# Patient Record
Sex: Female | Born: 1975
Health system: Southern US, Community
[De-identification: ages and names within clinical notes are randomized; demographics above are authoritative.]

## PROBLEM LIST (undated history)

## (undated) DIAGNOSIS — R519 Headache, unspecified: Secondary | ICD-10-CM

## (undated) DIAGNOSIS — R51 Headache: Secondary | ICD-10-CM

## (undated) DIAGNOSIS — E785 Hyperlipidemia, unspecified: Secondary | ICD-10-CM

## (undated) DIAGNOSIS — D649 Anemia, unspecified: Secondary | ICD-10-CM

## (undated) HISTORY — PX: WISDOM TOOTH EXTRACTION: SHX21

## (undated) HISTORY — PX: DILATION AND CURETTAGE OF UTERUS: SHX78

---

## 2009-04-25 ENCOUNTER — Emergency Department (HOSPITAL_COMMUNITY): Admission: EM | Admit: 2009-04-25 | Discharge: 2009-04-25 | Payer: Self-pay | Admitting: Emergency Medicine

## 2015-03-17 ENCOUNTER — Encounter (HOSPITAL_COMMUNITY): Payer: Self-pay | Admitting: Cardiology

## 2015-03-17 ENCOUNTER — Emergency Department (HOSPITAL_COMMUNITY)
Admission: EM | Admit: 2015-03-17 | Discharge: 2015-03-17 | Disposition: A | Discharge status: Home / Self Care | Payer: BLUE CROSS/BLUE SHIELD | Attending: Emergency Medicine | Admitting: Emergency Medicine

## 2015-03-17 DIAGNOSIS — Y92410 Unspecified street and highway as the place of occurrence of the external cause: Secondary | ICD-10-CM | POA: Insufficient documentation

## 2015-03-17 DIAGNOSIS — M545 Low back pain, unspecified: Secondary | ICD-10-CM

## 2015-03-17 DIAGNOSIS — Y999 Unspecified external cause status: Secondary | ICD-10-CM | POA: Insufficient documentation

## 2015-03-17 DIAGNOSIS — S199XXA Unspecified injury of neck, initial encounter: Secondary | ICD-10-CM | POA: Insufficient documentation

## 2015-03-17 DIAGNOSIS — S3992XA Unspecified injury of lower back, initial encounter: Secondary | ICD-10-CM | POA: Insufficient documentation

## 2015-03-17 DIAGNOSIS — Y9389 Activity, other specified: Secondary | ICD-10-CM | POA: Diagnosis not present

## 2015-03-17 DIAGNOSIS — M542 Cervicalgia: Secondary | ICD-10-CM

## 2015-03-17 DIAGNOSIS — S0990XA Unspecified injury of head, initial encounter: Secondary | ICD-10-CM | POA: Insufficient documentation

## 2015-03-17 MED ORDER — METHOCARBAMOL 500 MG PO TABS: 500.0000 mg | ORAL_TABLET | Freq: Two times a day (BID) | ORAL | Status: DC

## 2015-03-17 MED ORDER — NAPROXEN 500 MG PO TABS: 500.0000 mg | ORAL_TABLET | Freq: Two times a day (BID) | ORAL | Status: DC
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
## 2015-03-17 NOTE — ED Notes (Signed)
Declined W/C at D/C and was escorted to lobby by RN. 
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
## 2016-02-29 ENCOUNTER — Other Ambulatory Visit: Payer: Self-pay | Admitting: Obstetrics and Gynecology

## 2016-02-29 DIAGNOSIS — R140 Abdominal distension (gaseous): Secondary | ICD-10-CM

## 2016-03-01 ENCOUNTER — Ambulatory Visit
Admission: RE | Admit: 2016-03-01 | Discharge: 2016-03-01 | Disposition: A | Discharge status: Home / Self Care | Payer: BLUE CROSS/BLUE SHIELD | Source: Ambulatory Visit | Attending: Obstetrics and Gynecology | Admitting: Obstetrics and Gynecology

## 2016-03-01 DIAGNOSIS — R140 Abdominal distension (gaseous): Secondary | ICD-10-CM

## 2017-04-23 ENCOUNTER — Other Ambulatory Visit: Payer: Self-pay | Admitting: Obstetrics and Gynecology

## 2017-04-24 NOTE — Patient Instructions (Signed)
Your procedure is scheduled on: Friday, April 12  Enter through the [facility]trance of [facility] at: 6 am  Pick up the phone at the desk and dial [phone].  Call this number if you have problems the morning of surgery: [phone].  Remember: Do NOT eat or Do NOT drink clear liquids (including water) after midnight Thursday  Take these medicines the morning of surgery with a SIP OF WATER: None  Bring inhaler with you on day of surgery.  Do Not smoke on the day of surgery.  Stop herbal medications and supplements at this time.  Do NOT wear jewelry (body piercing), metal hair clips/bobby pins, make-up, or nail polish. Do NOT wear lotions, powders, or perfumes.  You may wear deoderant. Do NOT shave for 48 hours prior to surgery. Do NOT bring valuables to the hospital. Contacts, dentures, or bridgework may not be worn into surgery.  Have a responsible adult drive you home and stay with you for 24 hours after your procedure.

## 2017-05-03 ENCOUNTER — Encounter (HOSPITAL_COMMUNITY): Payer: Self-pay | Admitting: *Deleted

## 2017-05-03 ENCOUNTER — Other Ambulatory Visit: Payer: Self-pay

## 2017-05-03 ENCOUNTER — Encounter (HOSPITAL_COMMUNITY)
Admission: RE | Admit: 2017-05-03 | Discharge: 2017-05-03 | Disposition: A | Discharge status: Home / Self Care | Payer: BLUE CROSS/BLUE SHIELD | Source: Ambulatory Visit | Attending: Obstetrics and Gynecology | Admitting: Obstetrics and Gynecology

## 2017-05-10 ENCOUNTER — Ambulatory Visit (HOSPITAL_COMMUNITY): Payer: BLUE CROSS/BLUE SHIELD | Admitting: Registered Nurse

## 2017-05-10 ENCOUNTER — Encounter (HOSPITAL_COMMUNITY): Payer: Self-pay | Admitting: Emergency Medicine

## 2017-05-10 ENCOUNTER — Ambulatory Visit (HOSPITAL_COMMUNITY)
Admission: AD | Admit: 2017-05-10 | Discharge: 2017-05-10 | Disposition: A | Discharge status: Home / Self Care | Payer: BLUE CROSS/BLUE SHIELD | Source: Ambulatory Visit | Attending: Obstetrics and Gynecology | Admitting: Obstetrics and Gynecology

## 2017-05-10 ENCOUNTER — Other Ambulatory Visit: Payer: Self-pay

## 2017-05-10 ENCOUNTER — Encounter (HOSPITAL_COMMUNITY)
Admission: AD | Disposition: A | Discharge status: Home / Self Care | Payer: Self-pay | Source: Ambulatory Visit | Attending: Obstetrics and Gynecology

## 2017-05-10 DIAGNOSIS — Z793 Long term (current) use of hormonal contraceptives: Secondary | ICD-10-CM | POA: Diagnosis not present

## 2017-05-10 DIAGNOSIS — D060 Carcinoma in situ of endocervix: Secondary | ICD-10-CM | POA: Diagnosis present

## 2017-05-10 DIAGNOSIS — Z885 Allergy status to narcotic agent status: Secondary | ICD-10-CM | POA: Diagnosis not present

## 2017-05-10 DIAGNOSIS — Z91018 Allergy to other foods: Secondary | ICD-10-CM | POA: Diagnosis not present

## 2017-05-10 HISTORY — DX: Headache: R51

## 2017-05-10 HISTORY — DX: Hyperlipidemia, unspecified: E78.5

## 2017-05-10 HISTORY — DX: Anemia, unspecified: D64.9

## 2017-05-10 HISTORY — PX: CERVICAL CONIZATION W/BX: SHX1330

## 2017-05-10 HISTORY — DX: Headache, unspecified: R51.9

## 2017-05-10 LAB — CBC
HCT: 33.5 % — ABNORMAL LOW (ref 36.0–46.0)
HEMOGLOBIN: 11.3 g/dL — AB (ref 12.0–15.0)
MCH: 28.8 pg (ref 26.0–34.0)
MCHC: 33.7 g/dL (ref 30.0–36.0)
MCV: 85.5 fL (ref 78.0–100.0)
PLATELETS: 251 10*3/uL (ref 150–400)
RBC: 3.92 MIL/uL (ref 3.87–5.11)
RDW: 13.1 % (ref 11.5–15.5)
WBC: 6 10*3/uL (ref 4.0–10.5)

## 2017-05-10 SURGERY — CONE BIOPSY, CERVIX
Anesthesia: General

## 2017-05-10 MED ORDER — FENTANYL CITRATE (PF) 100 MCG/2ML IJ SOLN
INTRAMUSCULAR | Status: DC | PRN
  Administered 2017-05-10: 25 ug via INTRAVENOUS
  Administered 2017-05-10: 50 ug via INTRAVENOUS
  Administered 2017-05-10: 25 ug via INTRAVENOUS

## 2017-05-10 MED ORDER — FENTANYL CITRATE (PF) 100 MCG/2ML IJ SOLN
INTRAMUSCULAR | Status: AC
  Filled 2017-05-10: qty 2

## 2017-05-10 MED ORDER — VASOPRESSIN 20 UNIT/ML IV SOLN
INTRAVENOUS | Status: AC
  Filled 2017-05-10: qty 1

## 2017-05-10 MED ORDER — OXYCODONE HCL 5 MG/5ML PO SOLN: 5.0000 mg | Freq: Once | ORAL | Status: AC | PRN

## 2017-05-10 MED ORDER — FERRIC SUBSULFATE 259 MG/GM EX SOLN
CUTANEOUS | Status: AC
  Filled 2017-05-10: qty 8

## 2017-05-10 MED ORDER — OXYCODONE HCL 5 MG PO TABS
ORAL_TABLET | ORAL | Status: DC
Start: 2017-05-10 — End: 2017-05-10
  Filled 2017-05-10: qty 1

## 2017-05-10 MED ORDER — PROPOFOL 10 MG/ML IV BOLUS
INTRAVENOUS | Status: DC | PRN
  Administered 2017-05-10: 200 mg via INTRAVENOUS

## 2017-05-10 MED ORDER — LACTATED RINGERS IV SOLN
INTRAVENOUS | Status: DC
  Administered 2017-05-10: 07:00:00 via INTRAVENOUS

## 2017-05-10 MED ORDER — ACETIC ACID 4% SOLUTION
Status: DC | PRN
  Administered 2017-05-10: 1 via TOPICAL

## 2017-05-10 MED ORDER — MEPERIDINE HCL 25 MG/ML IJ SOLN: 6.2500 mg | INTRAMUSCULAR | Status: DC | PRN

## 2017-05-10 MED ORDER — LIDOCAINE HCL (CARDIAC) 20 MG/ML IV SOLN
INTRAVENOUS | Status: AC
  Filled 2017-05-10: qty 5

## 2017-05-10 MED ORDER — LIDOCAINE-EPINEPHRINE 1 %-1:100000 IJ SOLN
INTRAMUSCULAR | Status: AC
  Filled 2017-05-10: qty 1

## 2017-05-10 MED ORDER — ONDANSETRON HCL 4 MG/2ML IJ SOLN
INTRAMUSCULAR | Status: DC | PRN
  Administered 2017-05-10: 4 mg via INTRAVENOUS

## 2017-05-10 MED ORDER — SCOPOLAMINE 1 MG/3DAYS TD PT72
1.0000 | MEDICATED_PATCH | Freq: Once | TRANSDERMAL | Status: DC
  Administered 2017-05-10: 1.5 mg via TRANSDERMAL

## 2017-05-10 MED ORDER — ONDANSETRON HCL 4 MG/2ML IJ SOLN
INTRAMUSCULAR | Status: AC
  Filled 2017-05-10: qty 2

## 2017-05-10 MED ORDER — KETOROLAC TROMETHAMINE 30 MG/ML IJ SOLN
INTRAMUSCULAR | Status: DC | PRN
  Administered 2017-05-10: 30 mg via INTRAVENOUS
  Administered 2017-05-10: 30 mg via INTRAMUSCULAR

## 2017-05-10 MED ORDER — SODIUM CHLORIDE 0.9 % IJ SOLN
INTRAMUSCULAR | Status: AC
  Filled 2017-05-10: qty 10

## 2017-05-10 MED ORDER — IODINE STRONG (LUGOLS) 5 % PO SOLN
ORAL | Status: AC
Start: 2017-05-10 — End: 2017-05-10
  Filled 2017-05-10: qty 1

## 2017-05-10 MED ORDER — DEXAMETHASONE SODIUM PHOSPHATE 10 MG/ML IJ SOLN
INTRAMUSCULAR | Status: AC
  Filled 2017-05-10: qty 1

## 2017-05-10 MED ORDER — MIDAZOLAM HCL 2 MG/2ML IJ SOLN
INTRAMUSCULAR | Status: AC
  Filled 2017-05-10: qty 2

## 2017-05-10 MED ORDER — IODINE STRONG (LUGOLS) 5 % PO SOLN
ORAL | Status: DC | PRN
  Administered 2017-05-10: 0.1 mL via ORAL

## 2017-05-10 MED ORDER — DEXAMETHASONE SODIUM PHOSPHATE 10 MG/ML IJ SOLN
INTRAMUSCULAR | Status: DC | PRN
  Administered 2017-05-10: 10 mg via INTRAVENOUS

## 2017-05-10 MED ORDER — PROPOFOL 10 MG/ML IV BOLUS
INTRAVENOUS | Status: AC
  Filled 2017-05-10: qty 20

## 2017-05-10 MED ORDER — LIDOCAINE 2% (20 MG/ML) 5 ML SYRINGE
INTRAMUSCULAR | Status: DC | PRN
  Administered 2017-05-10: 80 mg via INTRAVENOUS

## 2017-05-10 MED ORDER — SCOPOLAMINE 1 MG/3DAYS TD PT72
MEDICATED_PATCH | TRANSDERMAL | Status: AC
  Administered 2017-05-10: 1.5 mg via TRANSDERMAL
  Filled 2017-05-10: qty 1

## 2017-05-10 MED ORDER — HYDROMORPHONE HCL 1 MG/ML IJ SOLN
INTRAMUSCULAR | Status: AC
  Administered 2017-05-10: 0.5 mg via INTRAVENOUS
  Filled 2017-05-10: qty 0.5

## 2017-05-10 MED ORDER — PROMETHAZINE HCL 25 MG/ML IJ SOLN
6.2500 mg | INTRAMUSCULAR | Status: DC | PRN
Start: 2017-05-10 — End: 2017-05-10

## 2017-05-10 MED ORDER — LIDOCAINE-EPINEPHRINE 1 %-1:100000 IJ SOLN
INTRAMUSCULAR | Status: DC | PRN
  Administered 2017-05-10: 13 mL

## 2017-05-10 MED ORDER — ACETIC ACID 5 % SOLN
Status: AC
  Filled 2017-05-10: qty 500

## 2017-05-10 MED ORDER — KETOROLAC TROMETHAMINE 30 MG/ML IJ SOLN
INTRAMUSCULAR | Status: AC
  Filled 2017-05-10: qty 2

## 2017-05-10 MED ORDER — MIDAZOLAM HCL 5 MG/5ML IJ SOLN
INTRAMUSCULAR | Status: DC | PRN
  Administered 2017-05-10: 2 mg via INTRAVENOUS

## 2017-05-10 MED ORDER — HYDROMORPHONE HCL 1 MG/ML IJ SOLN
0.2500 mg | INTRAMUSCULAR | Status: DC | PRN
  Administered 2017-05-10: 0.5 mg via INTRAVENOUS

## 2017-05-10 MED ORDER — OXYCODONE HCL 5 MG PO TABS
5.0000 mg | ORAL_TABLET | Freq: Once | ORAL | Status: AC | PRN
  Administered 2017-05-10: 5 mg via ORAL

## 2017-05-10 SURGICAL SUPPLY — 23 items
APPLICATOR COTTON TIP 6 STRL (MISCELLANEOUS) IMPLANT
APPLICATOR COTTON TIP 6IN STRL (MISCELLANEOUS)
BLADE SURG 11 STRL SS (BLADE) ×2 IMPLANT
ELECT BALL LEEP 5MM RED (ELECTRODE) ×2 IMPLANT
ELECT REM PT RETURN 9FT ADLT (ELECTROSURGICAL) ×2
ELECTRODE REM PT RTRN 9FT ADLT (ELECTROSURGICAL) ×1 IMPLANT
GLOVE BIOGEL PI IND STRL 7.0 (GLOVE) ×2 IMPLANT
GLOVE BIOGEL PI INDICATOR 7.0 (GLOVE) ×2
GLOVE ECLIPSE 6.5 STRL STRAW (GLOVE) ×2 IMPLANT
GOWN STRL REUS W/TWL LRG LVL3 (GOWN DISPOSABLE) ×4 IMPLANT
NS IRRIG 1000ML POUR BTL (IV SOLUTION) ×2 IMPLANT
PACK VAGINAL MINOR WOMEN LF (CUSTOM PROCEDURE TRAY) ×2 IMPLANT
PAD OB MATERNITY 4.3X12.25 (PERSONAL CARE ITEMS) ×2 IMPLANT
PENCIL BUTTON HOLSTER BLD 10FT (ELECTRODE) ×2 IMPLANT
SCOPETTES 8  STERILE (MISCELLANEOUS) ×1
SCOPETTES 8 STERILE (MISCELLANEOUS) ×1 IMPLANT
SPONGE SURGIFOAM ABS GEL 12-7 (HEMOSTASIS) ×2 IMPLANT
SUT VIC AB 0 CT1 27 (SUTURE) ×2
SUT VIC AB 0 CT1 27XBRD ANBCTR (SUTURE) ×2 IMPLANT
SUT VIC AB 3-0 SH 27 (SUTURE)
SUT VIC AB 3-0 SH 27X BRD (SUTURE) IMPLANT
SUT VICRYL 0 UR6 27IN ABS (SUTURE) IMPLANT
TOWEL OR 17X24 6PK STRL BLUE (TOWEL DISPOSABLE) ×4 IMPLANT

## 2017-05-10 NOTE — Transfer of Care (Signed)
Immediate Anesthesia Transfer of Care Note  Patient: Shirley Randolph  Procedure(s) Performed: Cold Knife CONIZATION CERVIX (N/A )  Patient Location: PACU  Anesthesia Type:General  Level of Consciousness: sedated  Airway & Oxygen Therapy: Patient Spontanous Breathing and Patient connected to nasal cannula oxygen  Post-op Assessment: Report given to RN and Post -op Vital signs reviewed and stable  Post vital signs: Reviewed and stable  Last Vitals:  Vitals Value Taken Time  BP 95/63 05/10/2017  8:10 AM  Temp    Pulse 101 05/10/2017  8:11 AM  Resp 13 05/10/2017  8:11 AM  SpO2 100 % 05/10/2017  8:11 AM  Vitals shown include unvalidated device data.  Last Pain:  Vitals:   05/10/17 0608  TempSrc: Oral      Patients Stated Pain Goal: 5 (05/10/17 [id number])  Complications: No apparent anesthesia complications

## 2017-05-10 NOTE — Anesthesia Postprocedure Evaluation (Signed)
Anesthesia Post Note  Patient: Shirley Randolph  Procedure(s) Performed: Cold Knife CONIZATION CERVIX (N/A )     Patient location during evaluation: PACU Anesthesia Type: General Level of consciousness: awake and alert Pain management: pain level controlled Vital Signs Assessment: post-procedure vital signs reviewed and stable Respiratory status: spontaneous breathing, nonlabored ventilation and respiratory function stable Cardiovascular status: blood pressure returned to baseline and stable Postop Assessment: no apparent nausea or vomiting Anesthetic complications: no    Last Vitals:  Vitals:   05/10/17 0845 05/10/17 0900  BP: 108/67 105/66  Pulse: 96 98  Resp: 13 15  Temp:    SpO2: 96% 96%    Last Pain:  Vitals:   05/10/17 0900  TempSrc:   PainSc: 3    Pain Goal: Patients Stated Pain Goal: 5 (05/10/17 [id number])               Lowella CurbWarren Ray Sapir Lavey

## 2017-05-10 NOTE — Anesthesia Preprocedure Evaluation (Signed)
Anesthesia Evaluation  Patient identified by MRN, date of birth, ID band Patient awake    Reviewed: Allergy & Precautions, NPO status , Patient's Chart, lab work & pertinent test results  Airway Mallampati: II  TM Distance: >3 FB Neck ROM: Full    Dental no notable dental hx.    Pulmonary neg pulmonary ROS,    Pulmonary exam normal breath sounds clear to auscultation       Cardiovascular negative cardio ROS Normal cardiovascular exam Rhythm:Regular Rate:Normal     Neuro/Psych  Headaches, negative neurological ROS  negative psych ROS   GI/Hepatic negative GI ROS, Neg liver ROS,   Endo/Other  negative endocrine ROS  Renal/GU negative Renal ROS  negative genitourinary   Musculoskeletal negative musculoskeletal ROS (+)   Abdominal (+) + obese,   Peds negative pediatric ROS (+)  Hematology negative hematology ROS (+) anemia ,   Anesthesia Other Findings   Reproductive/Obstetrics negative OB ROS                             Anesthesia Physical Anesthesia Plan  ASA: II  Anesthesia Plan: General   Post-op Pain Management:    Induction: Intravenous  PONV Risk Score and Plan: 3 and Ondansetron, Dexamethasone and Midazolam  Airway Management Planned: LMA  Additional Equipment:   Intra-op Plan:   Post-operative Plan: Extubation in OR  Informed Consent: I have reviewed the patients History and Physical, chart, labs and discussed the procedure including the risks, benefits and alternatives for the proposed anesthesia with the patient or authorized representative who has indicated his/her understanding and acceptance.   Dental advisory given  Plan Discussed with: CRNA  Anesthesia Plan Comments:         Anesthesia Quick Evaluation

## 2017-05-10 NOTE — Discharge Instructions (Signed)
Cervical Conization, Care After °This sheet gives you information about how to care for yourself after your procedure. Your doctor may also give you more specific instructions. If you have problems or questions, contact your doctor. °Follow these instructions at home: °Medicines °· Take over-the-counter and prescription medicines only as told by your doctor. °· Do not take aspirin until your doctor says it is okay. °· If you take pain medicine: °? You may have constipation. To help treat this, your doctor may tell you to: °§ Drink enough fluid to keep your pee (urine) clear or pale yellow. °§ Take medicines. °§ Eat foods that are high in fiber. These include fresh fruits and vegetables, whole grains, bran, and beans. °§ Limit foods that are high in fat and sugar. These include fried foods and sweet foods. °? Do not drive or use heavy machines. °General instructions °· You can eat your usual diet unless your doctor tells you not to do so. °· Take showers for the first week. Do not take baths, swim, or use hot tubs until your doctor says it is okay. °· Do not douche, use tampons, or have sex until your doctor says it is okay. °· For 7-14 days after your procedure, avoid: °? Being very active. °? Exercising. °? Heavy lifting. °· Keep all follow-up visits as told by your doctor. This is important. °Contact a doctor if: °· You have a rash. °· You are dizzy or lightheaded. °· You feel sick to your stomach (nauseous). °· You throw up (vomit). °· You have fluid from your vagina (vaginal discharge) that smells bad. °Get help right away if: °· There are blood clots coming from your vagina. °· You have more bleeding than you would have in a normal period. For example, you soak a pad in less than 1 hour. °· You have a fever. °· You have more and more cramps. °· You pass out (faint). °· You have pain when peeing. °· Your have a lot of pain. °· Your pain gets worse. °· Your pain does not get better when you take your  medicine. °· You have blood in your pee. °· You throw up (vomit). °Summary °· After your procedure, take over-the-counter and prescription medicines only as told by your doctor. °· Do not douche, use tampons, or have sex until your doctor says it is okay. °· For about 7-14 days after your procedure, try not to exercise or lift heavy objects. °· Get help right away if you have new symptoms, or if your symptoms become worse. °This information is not intended to replace advice given to you by your health care provider. Make sure you discuss any questions you have with your health care provider. °Document Released: 10/25/2007 Document Revised: 01/18/2016 Document Reviewed: 01/18/2016 °Elsevier Interactive Patient Education © 2017 [employer] ° ° °Post Anesthesia Home Care Instructions ° °Activity: °Get plenty of rest for the remainder of the day. A responsible individual must stay with you for 24 hours following the procedure.  °For the next 24 hours, DO NOT: °-Drive a car °-Operate machinery °-Drink alcoholic beverages °-Take any medication unless instructed by your physician °-Make any legal decisions or sign important papers. ° °Meals: °Start with liquid foods such as gelatin or soup. Progress to regular foods as tolerated. Avoid greasy, spicy, heavy foods. If nausea and/or vomiting occur, drink only clear liquids until the nausea and/or vomiting subsides. Call your physician if vomiting continues. ° °Special Instructions/Symptoms: °Your throat may feel dry or sore from   the anesthesia or the breathing tube placed in your throat during surgery. If this causes discomfort, gargle with warm salt water. The discomfort should disappear within 24 hours. ° °If you had a scopolamine patch placed behind your ear for the management of post- operative nausea and/or vomiting: ° °1. The medication in the patch is effective for 72 hours, after which it should be removed.  Wrap patch in a tissue and discard in the trash. Wash  hands thoroughly with soap and water. °2. You may remove the patch earlier than 72 hours if you experience unpleasant side effects which may include dry mouth, dizziness or visual disturbances. °3. Avoid touching the patch. Wash your hands with soap and water after contact with the patch. °  ° ° °

## 2017-05-10 NOTE — Anesthesia Procedure Notes (Signed)
Procedure Name: LMA Insertion Date/Time: 05/10/2017 7:34 AM Performed by: Jhonnie GarnerMarshall, Tylie Golonka M, CRNA Pre-anesthesia Checklist: Patient identified, Emergency Drugs available, Suction available and Patient being monitored Patient Re-evaluated:Patient Re-evaluated prior to induction Oxygen Delivery Method: Circle system utilized Preoxygenation: Pre-oxygenation with 100% oxygen Induction Type: IV induction Ventilation: Mask ventilation without difficulty LMA: LMA inserted LMA Size: 4.0 Number of attempts: 1 Placement Confirmation: positive ETCO2 and breath sounds checked- equal and bilateral Tube secured with: Tape Dental Injury: Teeth and Oropharynx as per pre-operative assessment

## 2017-05-10 NOTE — Brief Op Note (Signed)
05/10/2017  8:11 AM  PATIENT:  Sherran NeedsGwendolyn Lortz  42 y.o. female  PRE-OPERATIVE DIAGNOSIS:  Positive Endocervical Curettage, CIN 2/3  POST-OPERATIVE DIAGNOSIS:  Positive Endocervical Curettage, CIN 2/3  PROCEDURE: cold knife conization, ECC  SURGEON:  Surgeon(s) and Role:    * Maxie Betterousins, Tomorrow Dehaas, MD - Primary  PHYSICIAN ASSISTANT:   ASSISTANTS: none   ANESTHESIA:   general  EBL:  5 mL   BLOOD ADMINISTERED:none  DRAINS: none   LOCAL MEDICATIONS USED:  NONE  SPECIMEN:  Source of Specimen:  cone, ecc  DISPOSITION OF SPECIMEN:  PATHOLOGY  COUNTS:  YES  TOURNIQUET:  * No tourniquets in log *  DICTATIO: [id number]: [id number]  PLAN OF CARE: Discharge to home after PACU  PATIENT DISPOSITION:  PACU - hemodynamically stable.   Delay start of Pharmacological VTE agent (>24hrs) due to surgical blood loss or risk of bleeding: no

## 2017-05-11 ENCOUNTER — Encounter (HOSPITAL_COMMUNITY): Payer: Self-pay | Admitting: Obstetrics and Gynecology

## 2017-05-13 NOTE — Op Note (Signed)
NAME:  Shirley Randolph, [name]                ACCOUNT NO.:  MEDICAL RECORD NO.:  [id number]  LOCATION:                                 FACILITY:  PHYSICIAN:  Maxie BetterSheronette Ileana Chalupa, M.D.    DATE OF BIRTH:  DATE OF PROCEDURE:  05/10/2017 DATE OF DISCHARGE:                              OPERATIVE REPORT   PREOPERATIVE DIAGNOSIS:  Cervical intraepithelial neoplasia 3, on endocervical curetting.  PROCEDURES:  Cold knife conization, endocervical curetting.  POSTOPERATIVE DIAGNOSIS:  Cervical intraepithelial neoplasia 3, on endocervical curetting, pending final pathology.  ANESTHESIA:  General.  SURGEON:  Maxie BetterSheronette Rashawd Laskaris, M.D.  ASSISTANT:  None.  DESCRIPTION OF PROCEDURE:  Under adequate general anesthesia, the patient was placed in dorsal lithotomy position.  She was sterilely prepped and draped in usual fashion.  Then, a bivalve speculum was placed in the vagina.  0 Vicryl figure-of-eight stay sutures were placed at 3 and 9 o'clock paracervical positions.  Mild acetic acid was used to wash the vagina.  Lugol solution was placed on the cervix.  Lidocaine 1% with epinephrine was injected circumferentially in the cervix.  Using a #11 blade, a cone was removed with the Mayo scissors at the base.  Once this was done, endocervical curetting was then performed.  The base was then cauterized and Monsel solution applied.  SPECIMENS:  Endocervical curetting and conization specimen was tagged at 12 o'clock.  ESTIMATED BLOOD LOSS:  Minimal.  COMPLICATIONS:  None.  The patient tolerated the procedure well, was transferred to recovery room in stable condition.     Maxie BetterSheronette Breanna Mcdaniel, M.D.    [place]/[facility]  D:  05/10/2017  T:  05/11/2017  Job:  [id number]

## 2018-04-20 IMAGING — US US ABDOMEN COMPLETE
1 series · 14 of 25 positions shown · non-contrast
Comparison: None.

CLINICAL DATA: Abdominal bloating for 2 weeks.

EXAM:
ABDOMEN ULTRASOUND COMPLETE

[Series 1: us abdomen complete · 0.12mm/px · 14 of 84 slices shown]
[im 1/84]
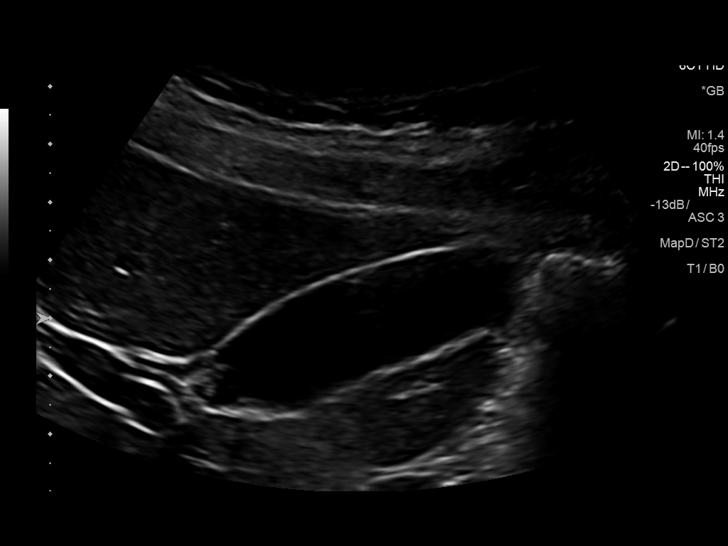
[im 7/84]
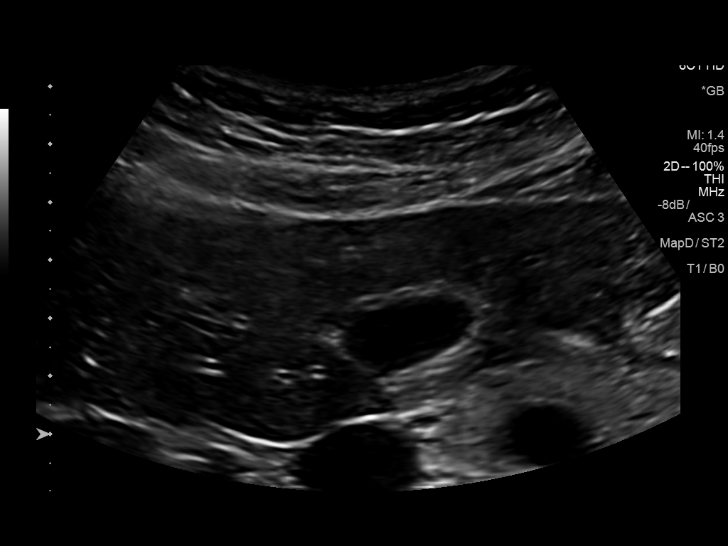
[im 14/84]
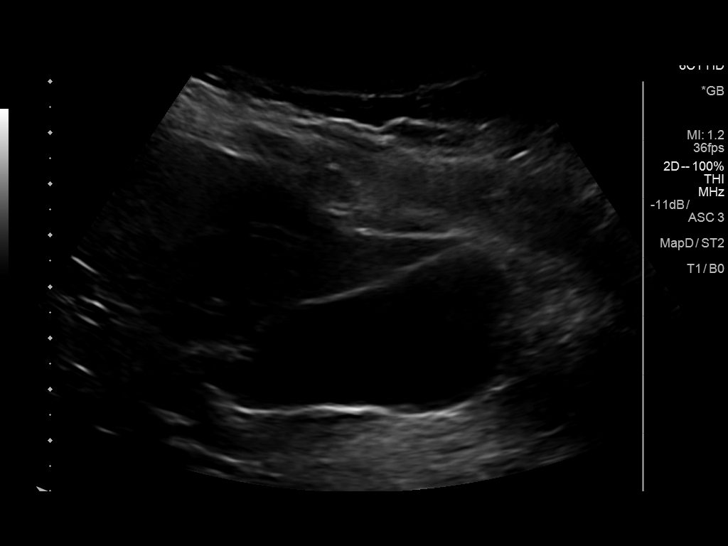
[im 21/84]
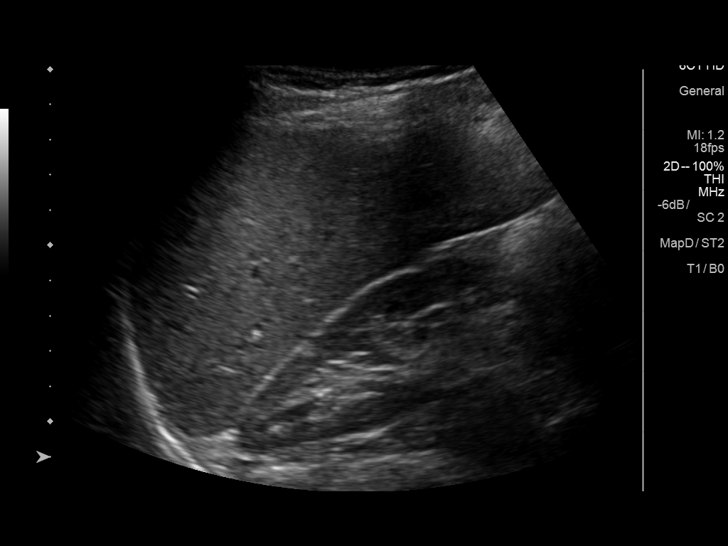
[im 28/84]
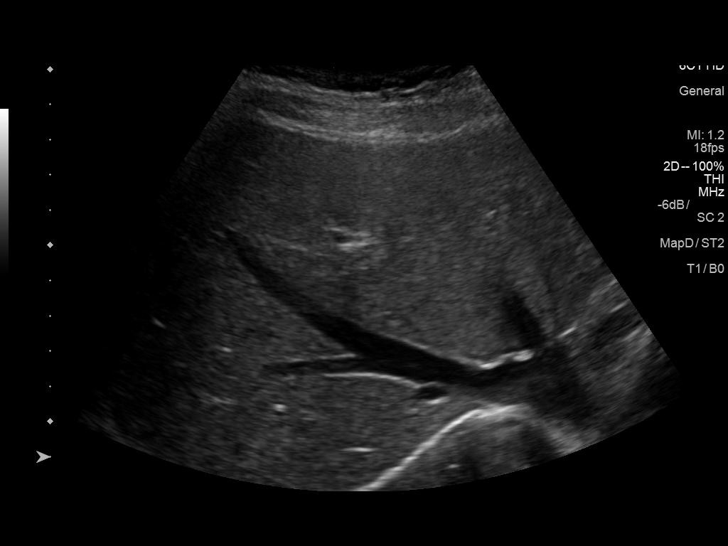
[im 32/84]
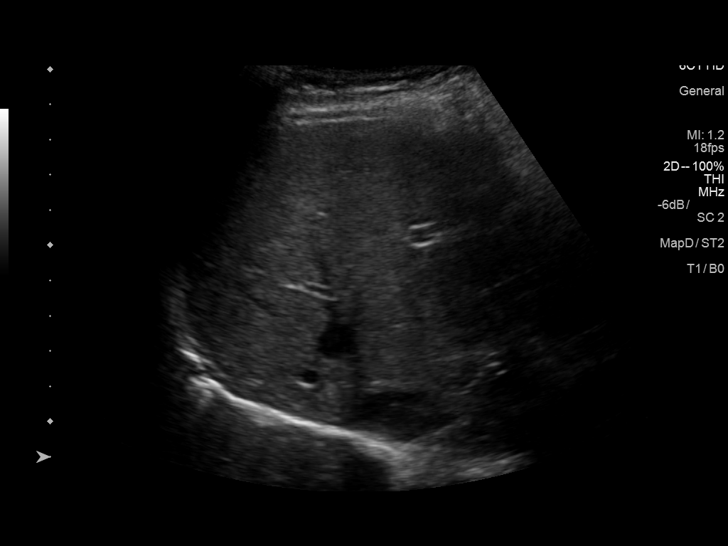
[im 39/84]
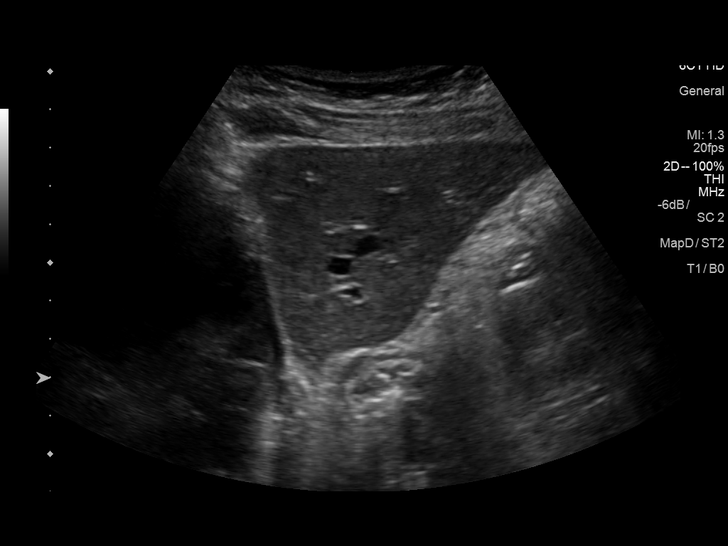
[im 45/84]
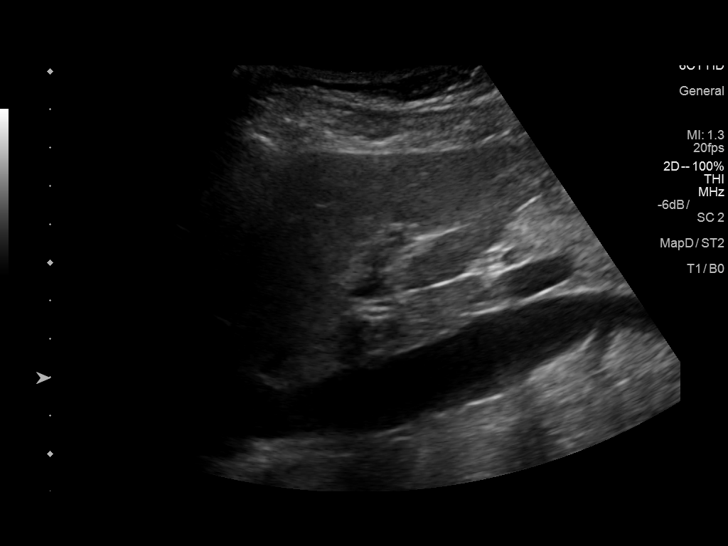
[im 52/84]
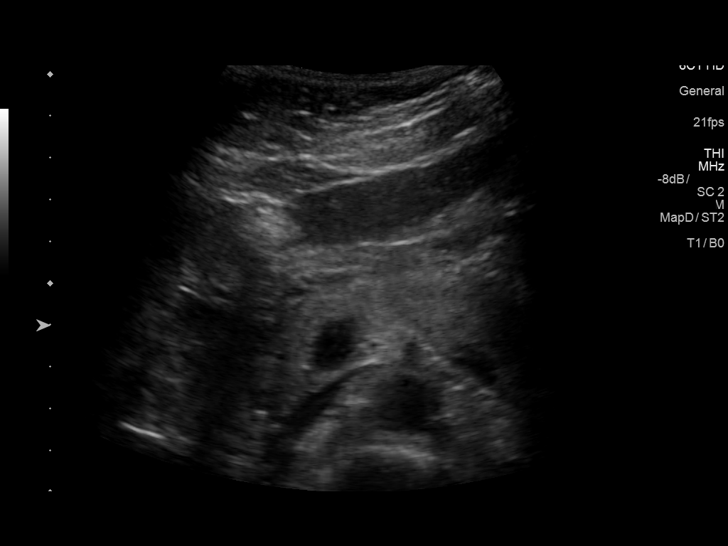
[im 56/84]
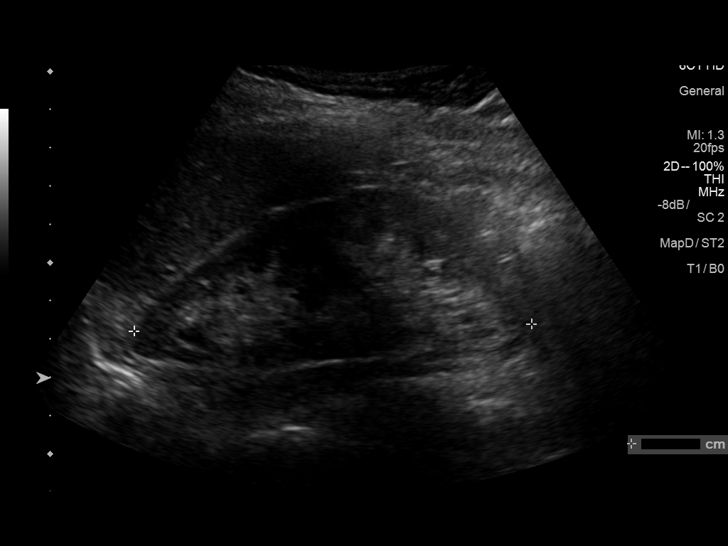
[im 63/84]
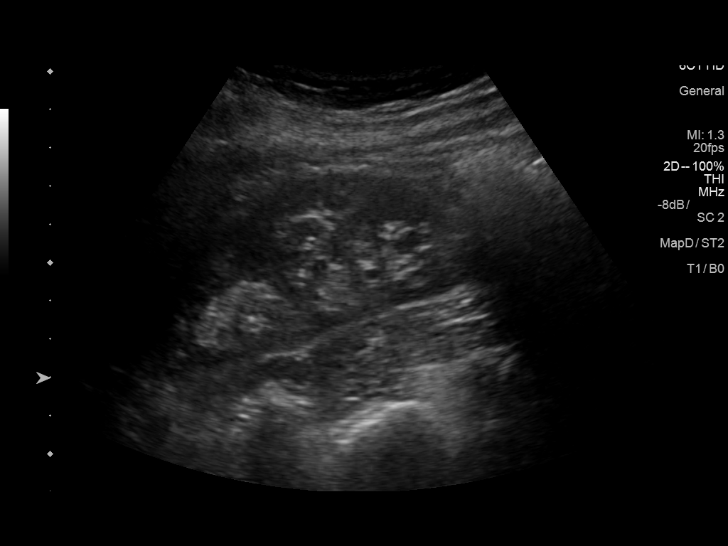
[im 70/84]
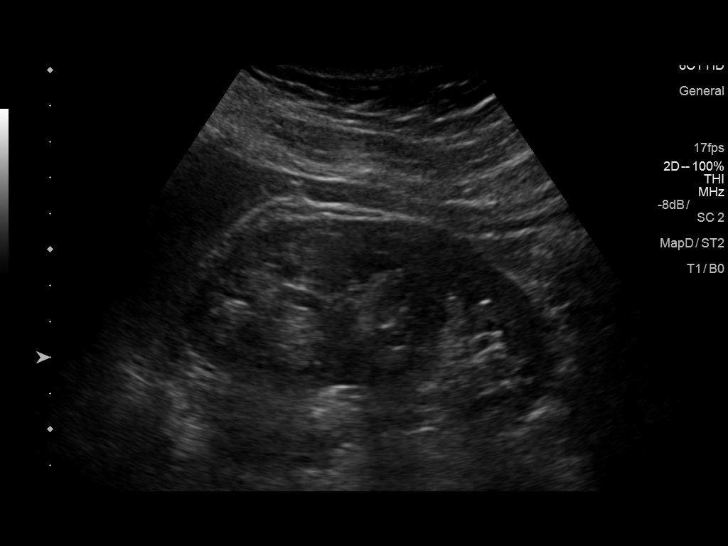
[im 77/84]
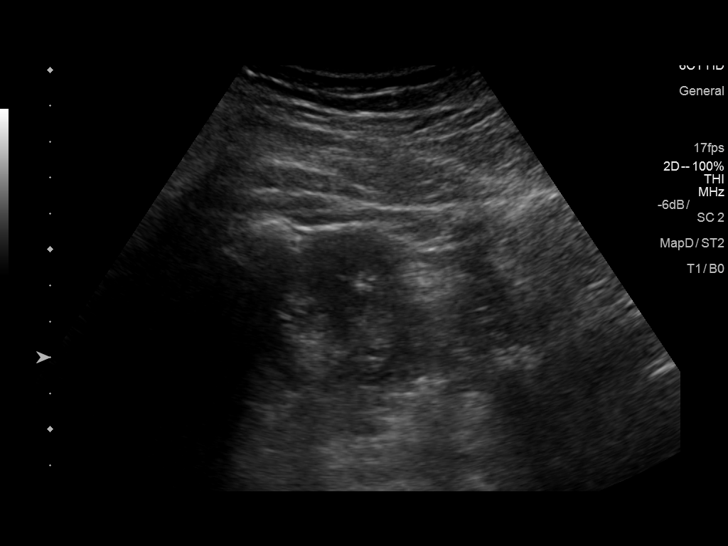
[im 84/84]
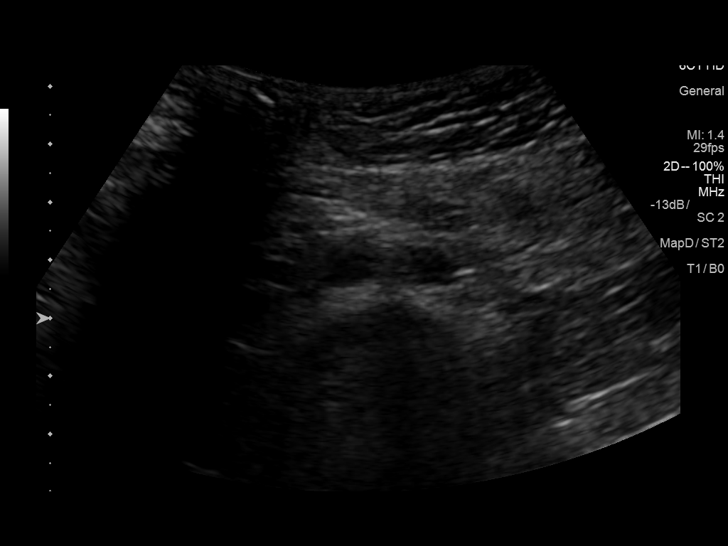

[14 of 25 positions shown; findings below may reference images not displayed]

FINDINGS: Gallbladder: No gallstones or wall thickening visualized. No
sonographic Murphy sign noted by sonographer.

Common bile duct: Diameter: 3.3 mm

Liver: No focal lesion identified. Within normal limits in
parenchymal echogenicity.

IVC: No abnormality visualized.

Pancreas: Visualized portion unremarkable.

Spleen: Size and appearance within normal limits.

Right Kidney: Length: 10.7 cm. Echogenicity within normal limits. No
mass or hydronephrosis visualized.

Left Kidney: Length: 10.3 cm. Echogenicity within normal limits. No
mass or hydronephrosis visualized.

Abdominal aorta: No aneurysm visualized.

Other findings: None.
IMPRESSION: Normal abdominal ultrasound.

## 2018-10-14 ENCOUNTER — Other Ambulatory Visit: Payer: Self-pay

## 2018-10-14 DIAGNOSIS — Z20822 Contact with and (suspected) exposure to covid-19: Secondary | ICD-10-CM

## 2018-10-16 LAB — NOVEL CORONAVIRUS, NAA: SARS-CoV-2, NAA: NOT DETECTED

## 2018-11-03 ENCOUNTER — Other Ambulatory Visit: Payer: Self-pay | Admitting: Registered"

## 2018-11-03 DIAGNOSIS — Z20822 Contact with and (suspected) exposure to covid-19: Secondary | ICD-10-CM

## 2018-11-05 LAB — NOVEL CORONAVIRUS, NAA: SARS-CoV-2, NAA: NOT DETECTED

## 2018-12-31 ENCOUNTER — Other Ambulatory Visit: Payer: Self-pay

## 2018-12-31 DIAGNOSIS — Z20822 Contact with and (suspected) exposure to covid-19: Secondary | ICD-10-CM

## 2019-01-03 LAB — NOVEL CORONAVIRUS, NAA: SARS-CoV-2, NAA: NOT DETECTED

## 2022-11-08 ENCOUNTER — Other Ambulatory Visit: Payer: Self-pay | Admitting: Family Medicine

## 2022-11-08 ENCOUNTER — Ambulatory Visit
Admission: RE | Admit: 2022-11-08 | Discharge: 2022-11-08 | Disposition: A | Discharge status: Home / Self Care | Payer: No Typology Code available for payment source | Source: Ambulatory Visit | Attending: Family Medicine | Admitting: Family Medicine

## 2022-11-08 DIAGNOSIS — M25512 Pain in left shoulder: Secondary | ICD-10-CM
